# Patient Record
Sex: Female | Born: 1995 | Race: Black or African American | Hispanic: No | Marital: Single | Smoking: Never smoker
Health system: Southern US, Community
[De-identification: ages and names within clinical notes are randomized; demographics above are authoritative.]

## PROBLEM LIST (undated history)

## (undated) HISTORY — PX: KNEE SURGERY: SHX244

---

## 2016-08-17 ENCOUNTER — Ambulatory Visit (HOSPITAL_COMMUNITY)
Admission: EM | Admit: 2016-08-17 | Discharge: 2016-08-17 | Disposition: A | Discharge status: Home / Self Care | Payer: BLUE CROSS/BLUE SHIELD | Attending: Family Medicine | Admitting: Family Medicine

## 2016-08-17 ENCOUNTER — Ambulatory Visit (INDEPENDENT_AMBULATORY_CARE_PROVIDER_SITE_OTHER): Payer: BLUE CROSS/BLUE SHIELD

## 2016-08-17 ENCOUNTER — Encounter (HOSPITAL_COMMUNITY): Payer: Self-pay | Admitting: Emergency Medicine

## 2016-08-17 DIAGNOSIS — S93401A Sprain of unspecified ligament of right ankle, initial encounter: Secondary | ICD-10-CM

## 2016-08-17 MED ORDER — HYDROCODONE-ACETAMINOPHEN 5-325 MG PO TABS: 1.0000 | ORAL_TABLET | Freq: Four times a day (QID) | ORAL | 0 refills | Status: AC | PRN

## 2016-08-17 NOTE — Discharge Instructions (Signed)
This is a particularly nasty ankle sprain and may take 4 weeks to really heal. He may notice that there is swelling for up to 2 months after a sprain like this.  Use the cam walker and crutches for the next few days and gradually start putting weight on that right foot toward the end of this next week. Gradually increasing amount of weight you put on it until you can walk without crutches.   It would be a good idea to stop in and see your personal doctor [place] or return here in 3 weeks to review exercises to rehabilitate the ankle.

## 2016-08-17 NOTE — ED Triage Notes (Addendum)
[facility] a [facility] of right ankle pain and swelling secondary to a fall while dancing 3 days ago. The patient presented with pain, swelling and limited ROM.

## 2016-08-17 NOTE — ED Provider Notes (Signed)
[facility]    CSN: [id number] Arrival date & time: 08/17/16  1754     History   Chief Complaint Chief Complaint  Patient presents with  . Ankle Pain    HPI Belinda Jones is a 21 y.o. female.   [facility] a [facility] of right ankle pain and swelling secondary to a fall while dancing 3 days ago. The patient presented with pain, swelling and limited ROM. She cannot bear any weight on the ankle.  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 No pertinent past medical history.  There are no active problems to display for this patient.   Past Surgical History:  Procedure Laterality Date  . KNEE SURGERY Right     OB History    No data available       Home Medications    Prior to Admission medications   Medication Sig Start Date End Date Taking? Authorizing Provider  HYDROcodone-acetaminophen (NORCO) 5-325 MG tablet Take 1 tablet by mouth every 6 (six) hours as needed for moderate pain. 08/17/16   Elvina SidleKurt Siennah Barrasso, MD    Family History History reviewed. No pertinent family history.  Social History Social History  Substance Use Topics  . Smoking status: Never Smoker  . Smokeless tobacco: Never Used  . Alcohol use No     Allergies   Patient has no known allergies.   Review of Systems Review of Systems  Musculoskeletal: Positive for gait problem and joint swelling.  All other systems reviewed and are negative.    Physical Exam Triage Vital Signs ED Triage Vitals  Enc Vitals Group     BP 08/17/16 1836 133/61     Pulse Rate 08/17/16 1836 78     Resp 08/17/16 1836 18     Temp 08/17/16 1836 99.4 F (37.4 C)     Temp Source 08/17/16 1836 Oral     SpO2 08/17/16 1836 100 %     Weight --      Height --      Head Circumference --      Peak Flow --      Pain Score 08/17/16 1839 8     Pain Loc --      Pain Edu? --      Excl. in GC? --     No data found.   Updated Vital Signs BP 133/61 (BP Location: Right Arm)   Pulse 78   Temp 99.4 F (37.4 C) (Oral)   Resp 18   LMP 08/10/2016 (Exact Date)   SpO2 100%    Physical Exam  Constitutional: She is oriented to person, place, and time. She appears well-developed and well-nourished.  HENT:  Right Ear: External ear normal.  Left Ear: External ear normal.  Mouth/Throat: Oropharynx is clear and moist.  Eyes: Conjunctivae are normal. Pupils are equal, round, and reactive to light.  Neck: Normal range of motion. Neck supple.  Pulmonary/Chest: Effort normal.  Musculoskeletal: She exhibits edema and tenderness. She exhibits no deformity.  Marked swelling right ankle with ecchymosis, diffusely tender, unable to dorsiflex or plantar flex without pain  Neurological: She is alert and oriented to person, place, and time.  Skin: Skin is warm and dry.  Nursing note and vitals reviewed.    UC Treatments / Results  Labs (all labs ordered are listed, but only abnormal results are displayed) Labs Reviewed - No data to  display  EKG  EKG Interpretation None       Radiology Dg Ankle Complete Right  Result Date: 08/17/2016 CLINICAL DATA:  Pain after trauma 2 days ago. EXAM: RIGHT ANKLE - COMPLETE 3+ VIEW COMPARISON:  None. FINDINGS: Bilateral soft tissue swelling. IMPRESSION: Negative. Electronically Signed   By: Gerome Sam III M.D   On: 08/17/2016 19:02    Procedures Procedures (including critical care time)  Medications Ordered in UC Medications - No data to display   Initial Impression / Assessment and Plan / UC Course  I have reviewed the triage vital signs and the nursing notes.  Pertinent labs & imaging results that were available during my care of the patient were reviewed by me and considered in my medical decision making (see chart for details).     Final Clinical Impressions(s) / UC Diagnoses   Final diagnoses:  Sprain of right ankle, unspecified  ligament, initial encounter    New Prescriptions New Prescriptions   HYDROCODONE-ACETAMINOPHEN (NORCO) 5-325 MG TABLET    Take 1 tablet by mouth every 6 (six) hours as needed for moderate pain.     Elvina Sidle, MD 08/17/16 Ebony Cargo

## 2018-02-25 IMAGING — DX DG ANKLE COMPLETE 3+V*R*
3 series · 3 of 3 positions shown · non-contrast
Comparison: None.

CLINICAL DATA: Pain after trauma 2 days ago.

EXAM:
RIGHT ANKLE - COMPLETE 3+ VIEW

[ankle ap]
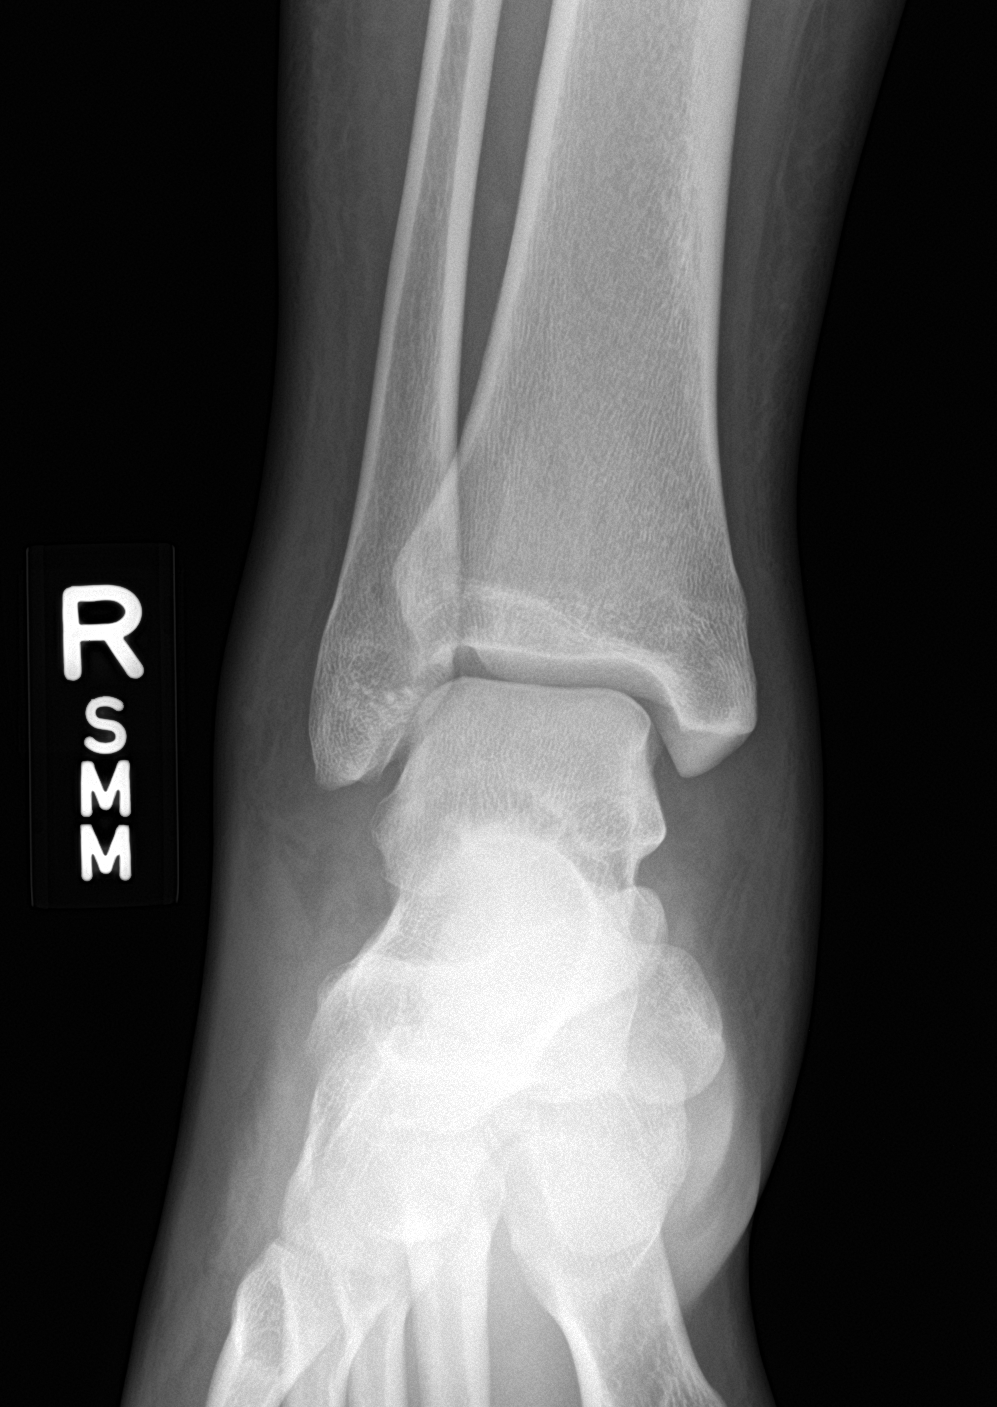

[ankle obl]
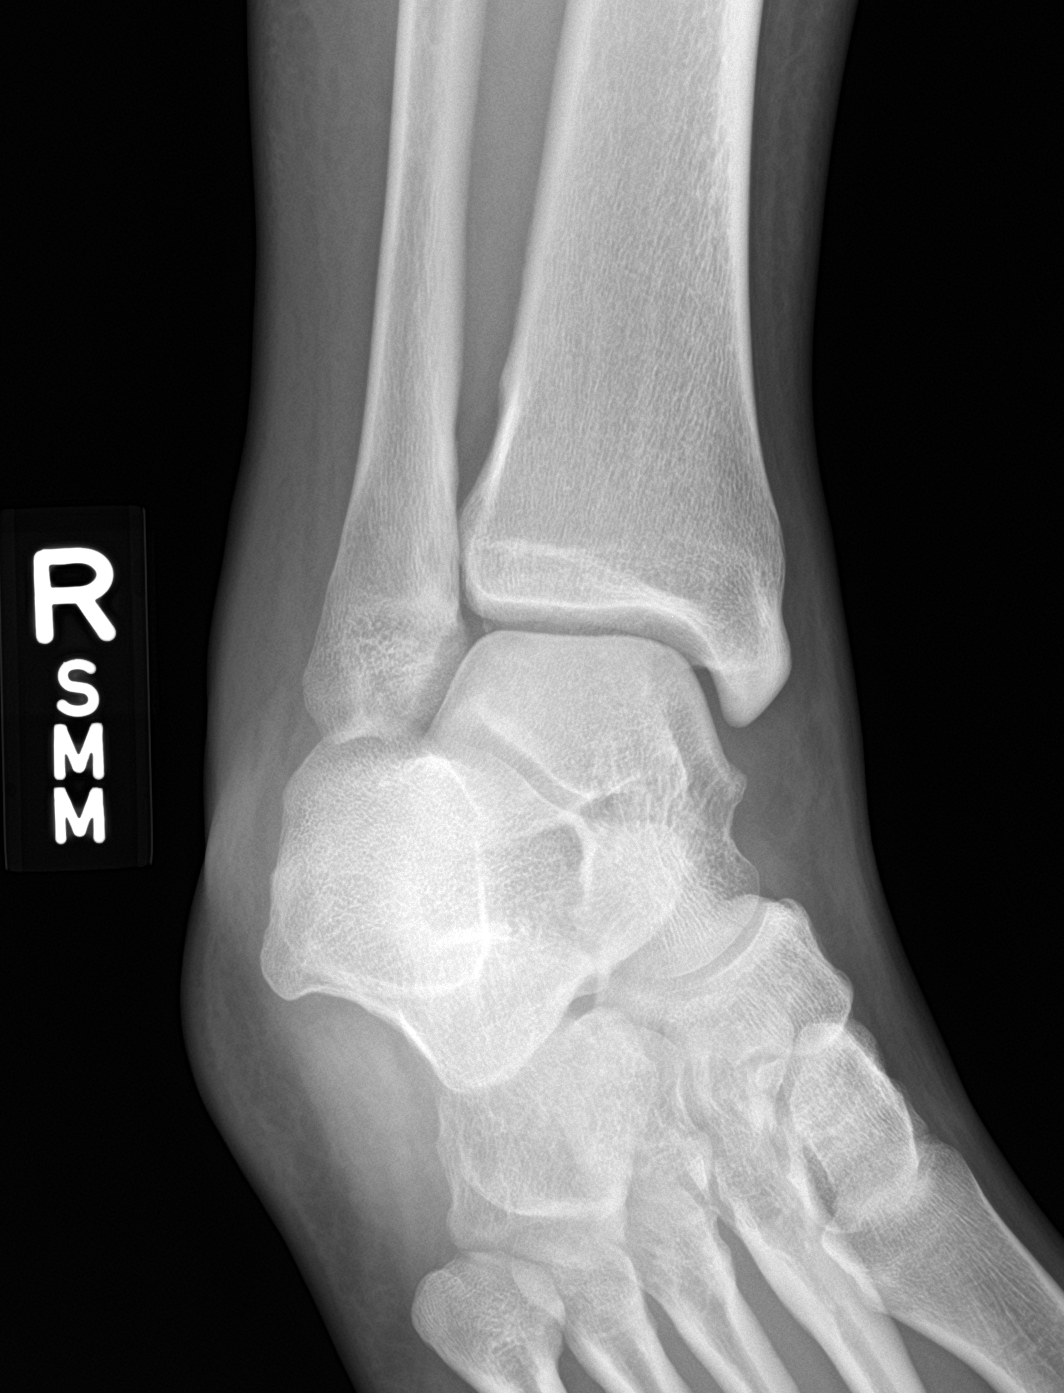

[ankle lat]
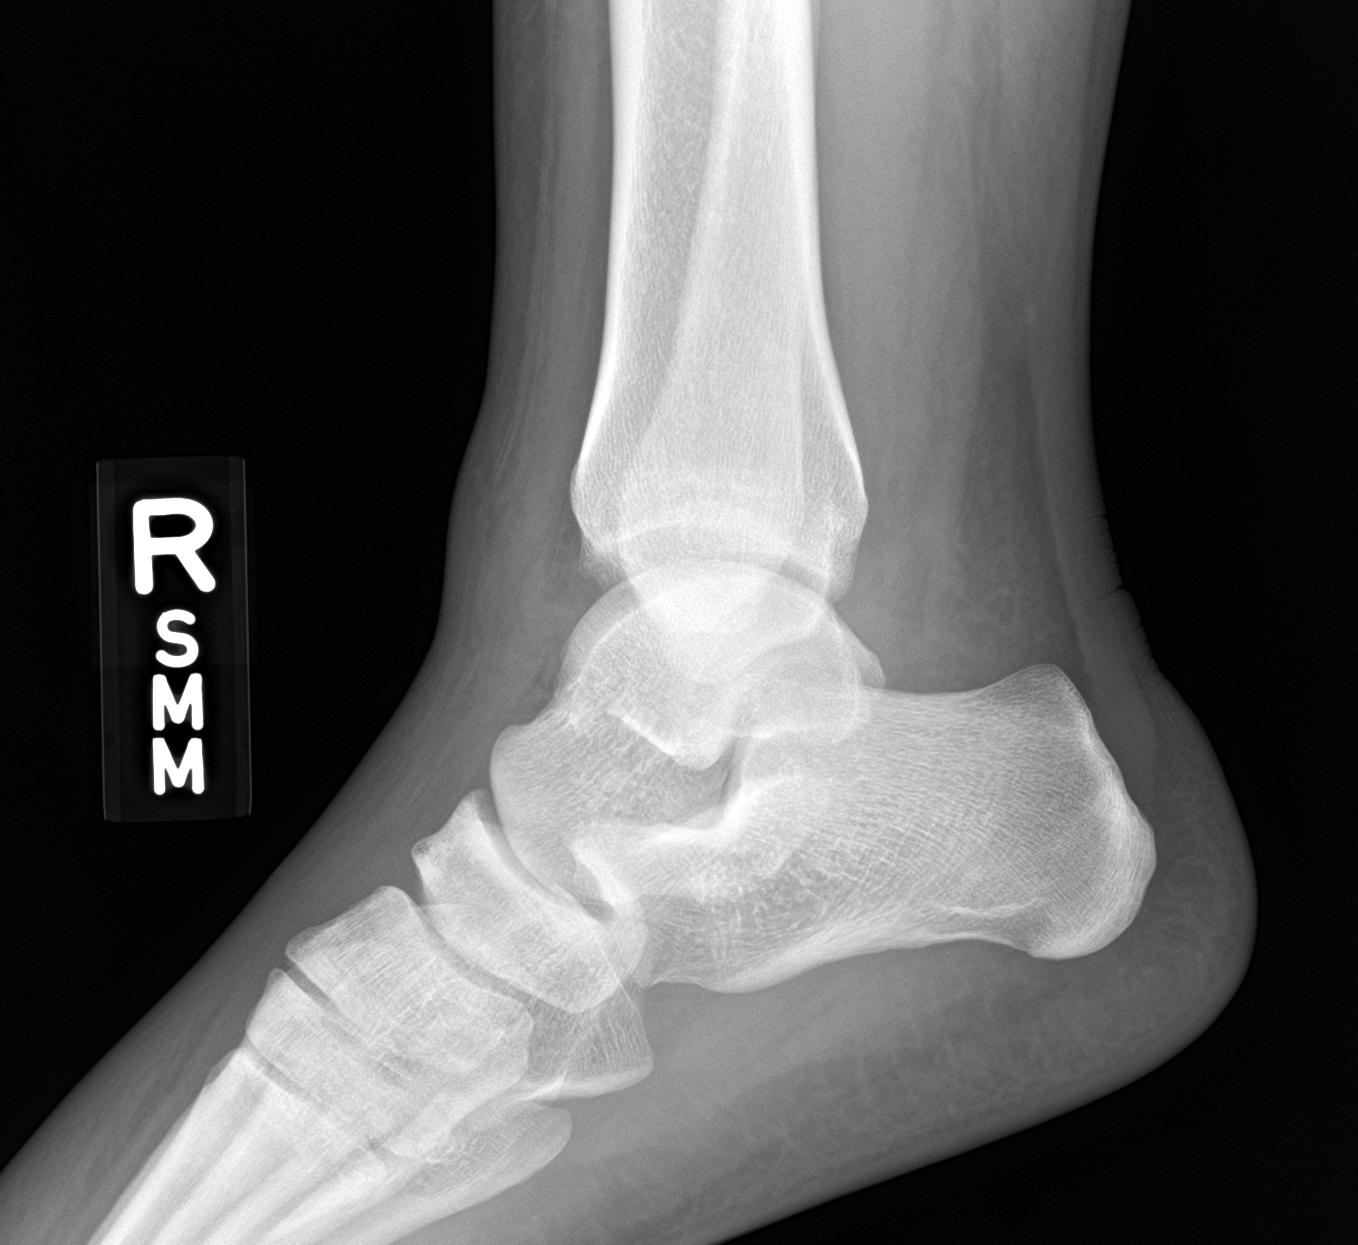

[3 of 3 positions shown; findings below may reference images not displayed]

FINDINGS: Bilateral soft tissue swelling.
IMPRESSION: Negative.
